# Patient Record
Sex: Male | Born: 1980 | Race: White | Hispanic: Yes | Marital: Single | Smoking: Current some day smoker
Health system: Southern US, Community
[De-identification: ages and names within clinical notes are randomized; demographics above are authoritative.]

---

## 2010-01-04 ENCOUNTER — Emergency Department (HOSPITAL_BASED_OUTPATIENT_CLINIC_OR_DEPARTMENT_OTHER): Admission: EM | Admit: 2010-01-04 | Discharge: 2010-01-04 | Payer: Self-pay | Admitting: Gastroenterology

## 2010-06-09 LAB — DIFFERENTIAL
Eosinophils Relative: 2 % (ref 0–5)
Lymphocytes Relative: 24 % (ref 12–46)
Lymphs Abs: 1.4 10*3/uL (ref 0.7–4.0)
Monocytes Relative: 7 % (ref 3–12)

## 2010-06-09 LAB — RAPID STREP SCREEN (MED CTR MEBANE ONLY): Streptococcus, Group A Screen (Direct): NEGATIVE

## 2010-06-09 LAB — CBC
HCT: 47.1 % (ref 39.0–52.0)
Hemoglobin: 16.4 g/dL (ref 13.0–17.0)
MCV: 90.5 fL (ref 78.0–100.0)
RBC: 5.21 MIL/uL (ref 4.22–5.81)
WBC: 6 10*3/uL (ref 4.0–10.5)

## 2010-06-09 LAB — HEPATITIS B SURFACE ANTIGEN: Hepatitis B Surface Ag: NEGATIVE

## 2010-06-09 LAB — COMPREHENSIVE METABOLIC PANEL
BUN: 14 mg/dL (ref 6–23)
Chloride: 103 mEq/L (ref 96–112)
GFR calc non Af Amer: >60 mL/min (ref >60)
Glucose, Bld: 98 mg/dL (ref 70–99)
Potassium: 4.2 mEq/L (ref 3.5–5.1)

## 2010-06-09 LAB — URINALYSIS, ROUTINE W REFLEX MICROSCOPIC
Hgb urine dipstick: NEGATIVE
Nitrite: NEGATIVE
Specific Gravity, Urine: 1.018 (ref 1.005–1.030)
Urobilinogen, UA: 0.2 mg/dL (ref 0.0–1.0)
pH: 7 (ref 5.0–8.0)

## 2010-06-09 LAB — SEDIMENTATION RATE: Sed Rate: 2 mm/hr (ref 0–16)

## 2010-06-09 LAB — GC/CHLAMYDIA PROBE AMP, GENITAL
Chlamydia, DNA Probe: NEGATIVE
GC Probe Amp, Genital: NEGATIVE

## 2012-03-28 ENCOUNTER — Ambulatory Visit: Payer: Self-pay

## 2012-03-28 ENCOUNTER — Ambulatory Visit: Payer: Self-pay | Admitting: Family Medicine

## 2012-03-28 VITALS — BP 130/83 | HR 64 | Temp 98.4°F | Resp 16 | Ht 63.5 in | Wt 194.0 lb

## 2012-03-28 DIAGNOSIS — M545 Low back pain, unspecified: Secondary | ICD-10-CM

## 2012-03-28 DIAGNOSIS — S39012A Strain of muscle, fascia and tendon of lower back, initial encounter: Secondary | ICD-10-CM

## 2012-03-28 DIAGNOSIS — K5900 Constipation, unspecified: Secondary | ICD-10-CM

## 2012-03-28 DIAGNOSIS — R1012 Left upper quadrant pain: Secondary | ICD-10-CM

## 2012-03-28 LAB — POCT CBC
Granulocyte percent: 69.5 %G (ref 37–80)
HCT, POC: 53.7 % (ref 43.5–53.7)
Hemoglobin: 17.5 g/dL (ref 14.1–18.1)
POC Granulocyte: 4.4 (ref 2–6.9)

## 2012-03-28 LAB — POCT UA - MICROSCOPIC ONLY
Bacteria, U Microscopic: NEGATIVE
Mucus, UA: NEGATIVE

## 2012-03-28 LAB — POCT URINALYSIS DIPSTICK
Bilirubin, UA: NEGATIVE
Blood, UA: NEGATIVE
Ketones, UA: NEGATIVE
Leukocytes, UA: NEGATIVE
Spec Grav, UA: 1.02
pH, UA: 6

## 2012-03-28 MED ORDER — METHOCARBAMOL 750 MG PO TABS: ORAL_TABLET | ORAL | Status: DC

## 2012-03-28 MED ORDER — OXAPROZIN 600 MG PO TABS: ORAL_TABLET | ORAL | Status: DC

## 2012-03-28 MED ORDER — HYDROCODONE-ACETAMINOPHEN 5-500 MG PO TABS: ORAL_TABLET | ORAL | Status: AC

## 2012-03-28 NOTE — Progress Notes (Signed)
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 For the past 2 weeks he has had low back pain. It is now working way up to where hurts also up between the scapula. He knows of no specific injury. He does have to handle his equipment he also has had pain in the left upper quadrant of the abdomen. Denies any urinary or bowel symptoms. No nausea or vomiting. He has not had bad back pain problems like this is in the past.  Objective: Stocky gentleman in no major distress. He is uncomfortable when he tried to move around. Mild tenderness along the paraspinous muscles of the back. Most of the tenderness is in the sacral and SI regions of the low back. Abdomen is soft without masses. Mild left upper quadrant tenderness. Bowel sounds normal.  Assessment: Low back pain Upper back pain  left upper quadrant abdominal pain  Plan: LS spine CBC Urine  [facility] reading (PRIMARY) by  Dr. Alwyn Ren No acute findings  Results for orders placed in visit on 03/28/12  POCT CBC      Component Value Range   WBC 6.4  4.6 - 10.2 K/uL   Lymph, poc 1.5  0.6 - 3.4   POC LYMPH PERCENT 24.2  10 - 50 %L   MID (cbc) 0.4  0 - 0.9   POC MID % 6.3  0 - 12 %M   POC Granulocyte 4.4  2 - 6.9   Granulocyte percent 69.5  37 - 80 %G   RBC 5.77  4.69 - 6.13 M/uL   Hemoglobin 17.5  14.1 - 18.1 g/dL   HCT, POC 40.9  81.1 - 53.7 %   MCV 93.1  80 - 97 fL   MCH, POC 30.3  27 - 31.2 pg   MCHC 32.6  31.8 - 35.4 g/dL   RDW, POC 91.4     Platelet Count, POC 252  142 - 424 K/uL   MPV 8.6  0 - 99.8 fL  POCT UA - MICROSCOPIC ONLY      Component Value Range   WBC, Ur, HPF, POC 0-5     RBC, urine, microscopic 0-1     Bacteria, U Microscopic neg     Mucus, UA neg     Epithelial cells, urine per micros 1-3     Crystals, Ur, HPF, POC neg     Casts, Ur, LPF, POC neg     Yeast, UA neg    POCT URINALYSIS DIPSTICK      Component Value Range   Color, UA yellow     Clarity, UA clear     Glucose, UA neg     Bilirubin, UA neg     Ketones, UA neg     Spec Grav, UA 1.020     Blood, UA neg     pH, UA 6.0     Protein, UA neg     Urobilinogen, UA 0.2     Nitrite, UA neg     Leukocytes, UA Negative     Assessment: Back pain  left upper quadrant abdominal pain, possibly secondary to constipation  Plan: Pain medicine, muscle relaxant, and some MiraLax.Marland Kitchen

## 2012-03-28 NOTE — Patient Instructions (Addendum)
The back pain is being caused by back strain of the muscles. You need to try to avoid heavy lifting and straining for the next week to try and rest her back some. I am prescribing some muscle relaxant and pain medication for your back.  The pain in the left upper abdomen is probably being caused by some stool that is backed up high in your colon. You have a lot of gas. I want you to take something called MiraLax which she can buy at the pharmacy. Take one dose every day until your stools are loose. Then it can be stopped.  Return if worse.

## 2014-12-04 IMAGING — CR DG LUMBAR SPINE 2-3V
3 series · 3 of 3 positions shown · non-contrast
Comparison: No priors.

CLINICAL DATA: Low back pain.

LUMBAR SPINE - 2-3 VIEW

[AP]
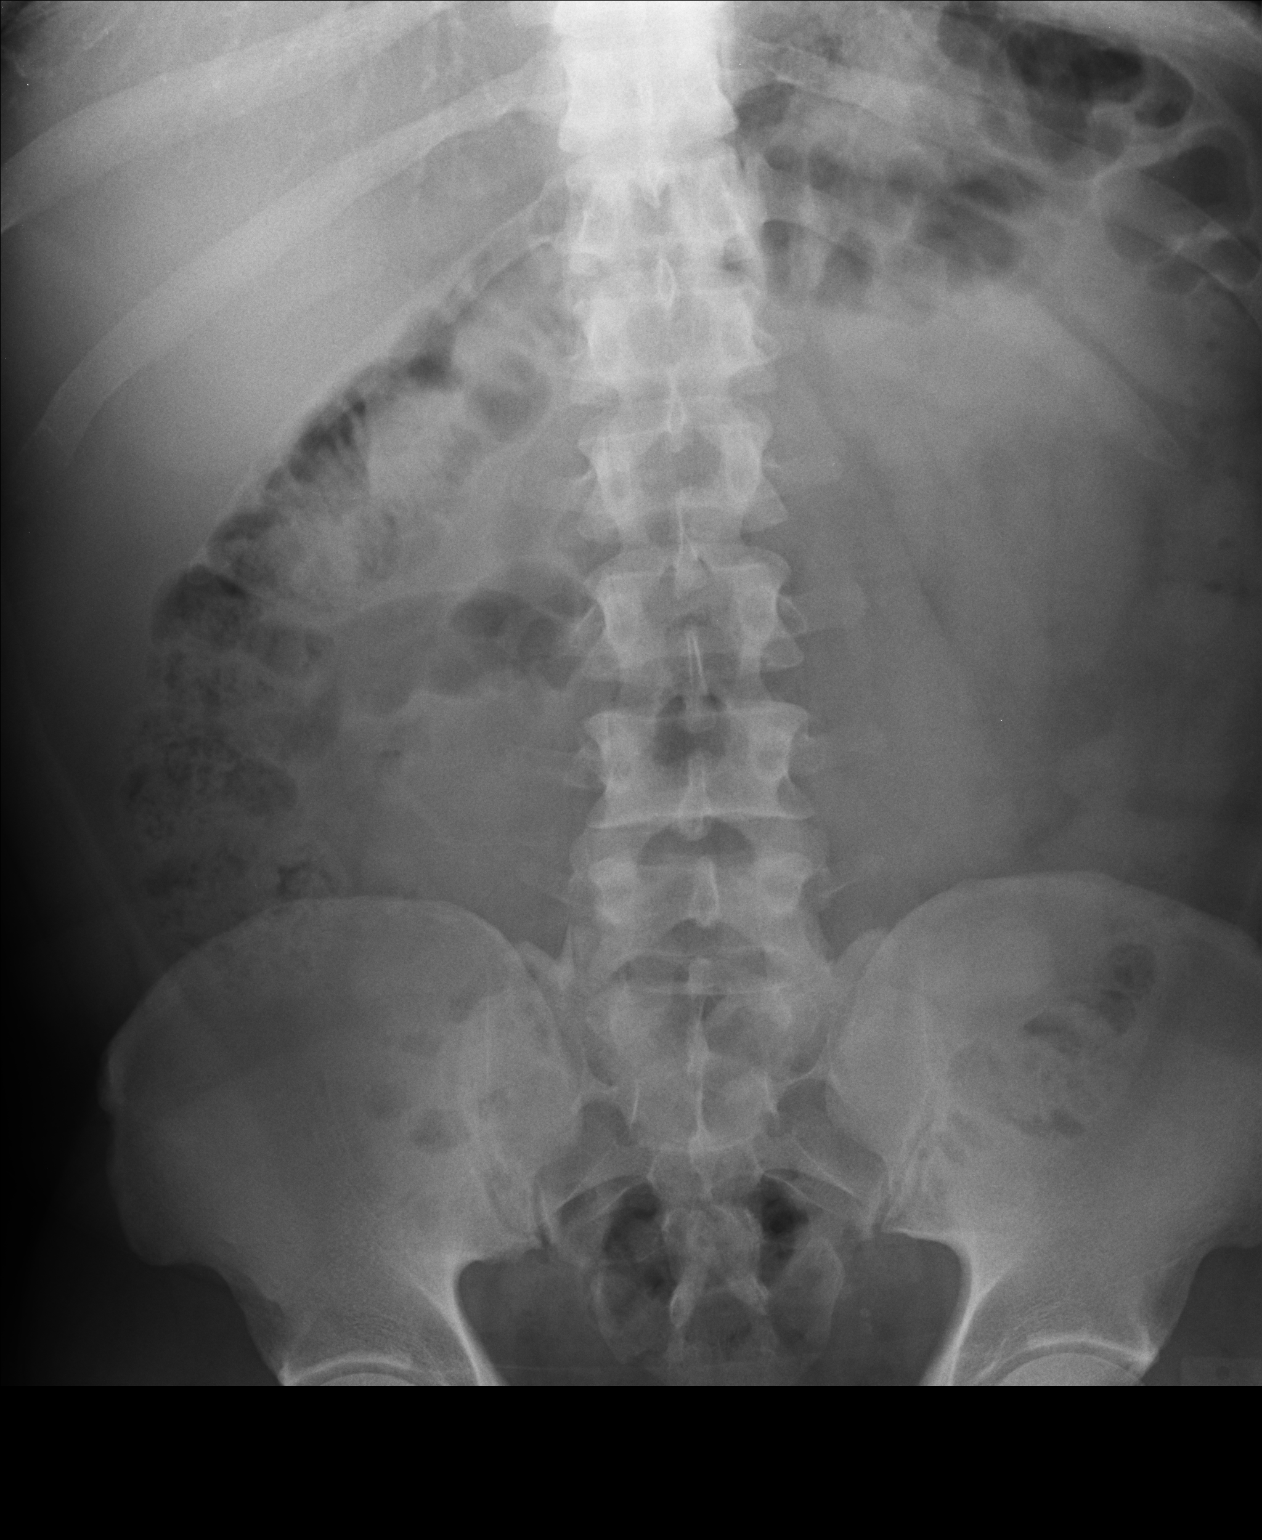

[lateral]
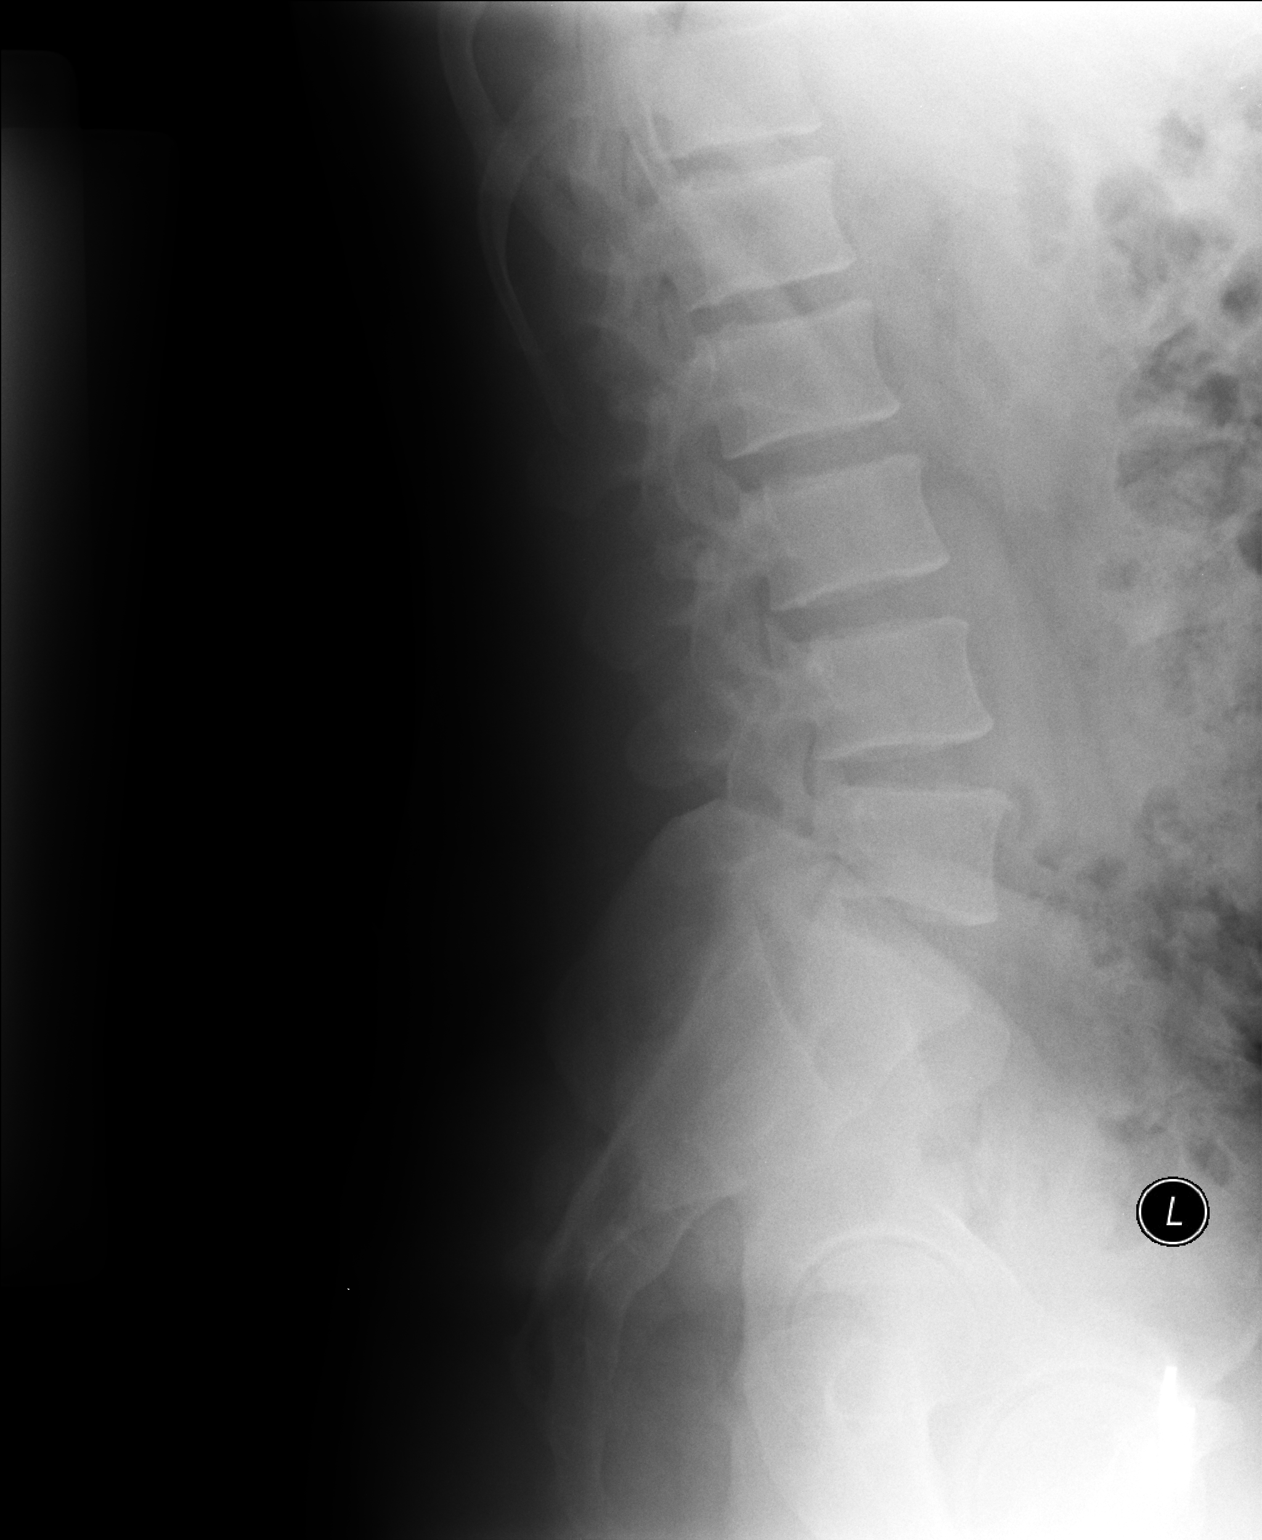

[l5 s1]
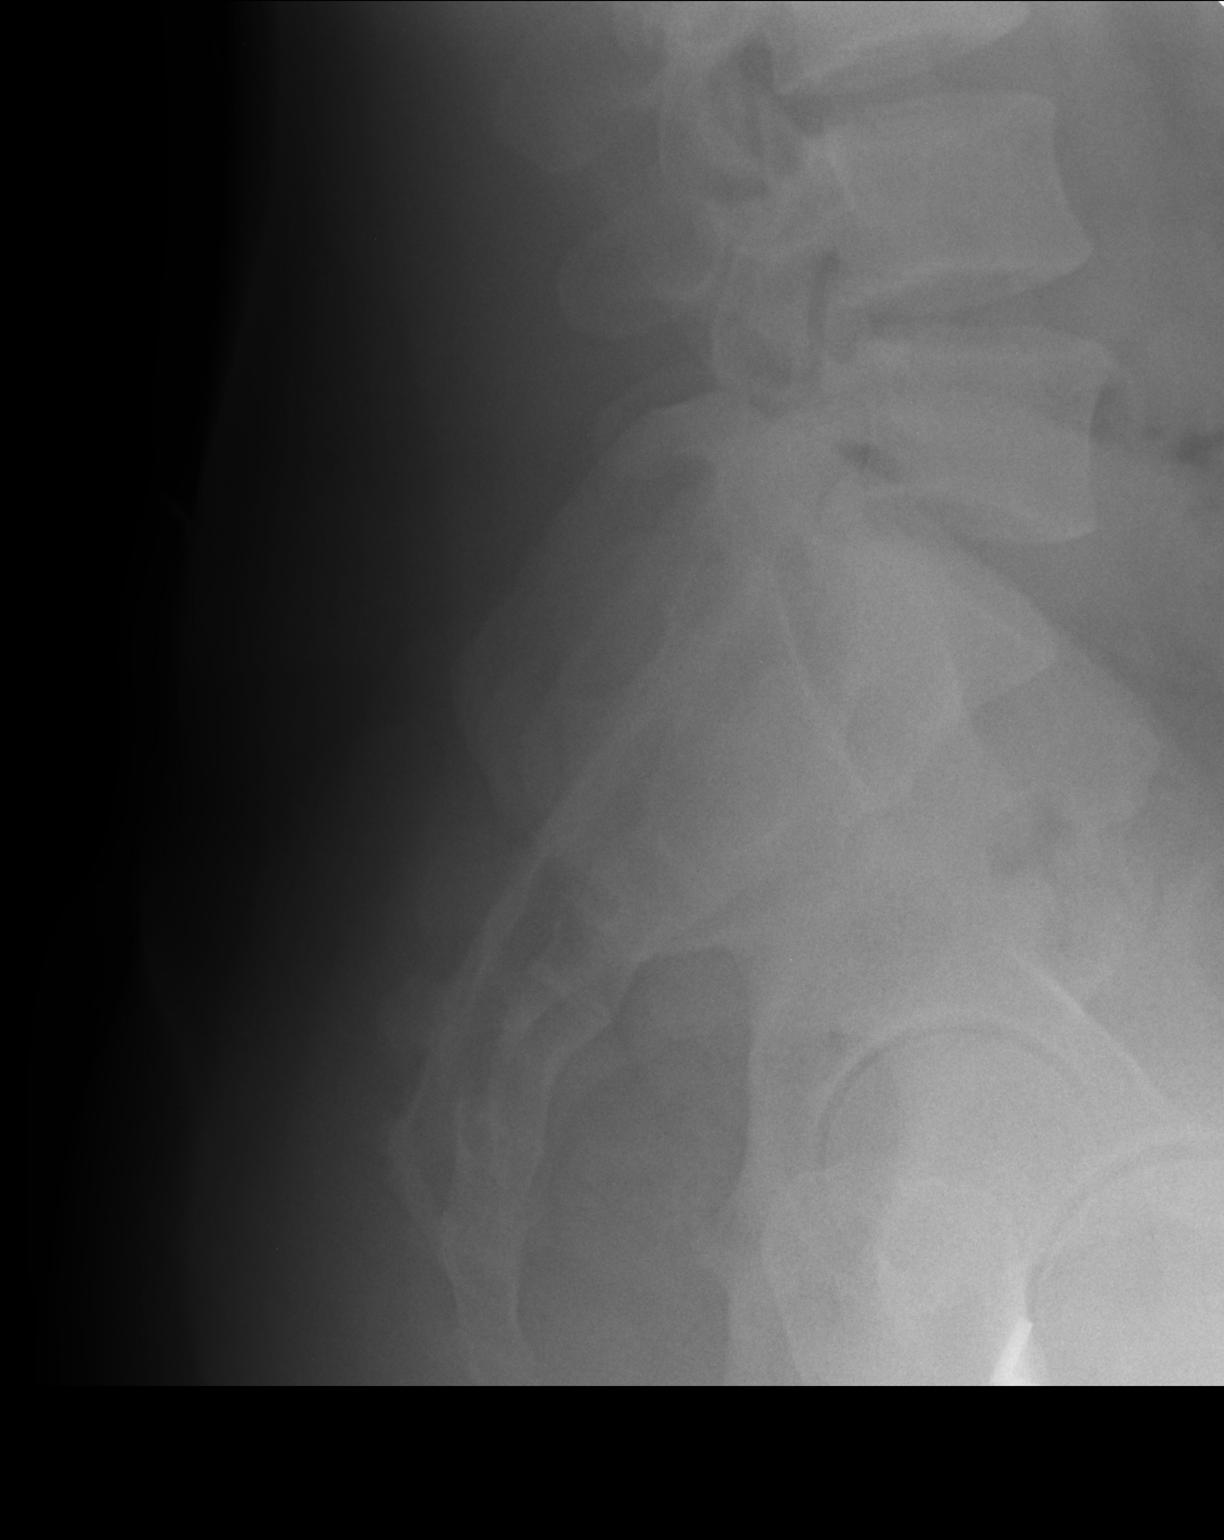

[3 of 3 positions shown; findings below may reference images not displayed]

FINDINGS: Three views of the lumbar spine demonstrate no acute
displaced fracture or definite compression type fracture.
Alignment is anatomic.
IMPRESSION: 1.  No acute radiographic abnormality of the thoracic spine.

## 2016-07-19 ENCOUNTER — Ambulatory Visit (INDEPENDENT_AMBULATORY_CARE_PROVIDER_SITE_OTHER): Payer: Self-pay | Admitting: Emergency Medicine

## 2016-07-19 VITALS — BP 125/84 | HR 62 | Temp 98.0°F | Resp 18 | Ht 63.5 in | Wt 199.0 lb

## 2016-07-19 DIAGNOSIS — H65192 Other acute nonsuppurative otitis media, left ear: Secondary | ICD-10-CM

## 2016-07-19 DIAGNOSIS — H6690 Otitis media, unspecified, unspecified ear: Secondary | ICD-10-CM | POA: Insufficient documentation

## 2016-07-19 DIAGNOSIS — H9203 Otalgia, bilateral: Secondary | ICD-10-CM

## 2016-07-19 MED ORDER — NEOMYCIN-COLIST-HC-THONZONIUM 3.3-3-10-0.5 MG/ML OT SUSP: 3.0000 [drp] | Freq: Three times a day (TID) | OTIC | 0 refills | Status: AC

## 2016-07-19 MED ORDER — AMOXICILLIN 500 MG PO CAPS: 500.0000 mg | ORAL_CAPSULE | Freq: Three times a day (TID) | ORAL | 0 refills | Status: AC

## 2016-07-19 NOTE — Progress Notes (Signed)
Clayton Goodwin 36 y.o.   Chief Complaint  Patient presents with  . Ear Pain    Bilateral    HISTORY OF PRESENT ILLNESS: This is a 36 y.o. male complaining of bilateral earache x 3 weeks. Denies trauma. No other significant symptoms.  HPI   Prior to Admission medications   Medication Sig Start Date End Date Taking? Authorizing Provider  HYDROcodone-acetaminophen (VICODIN) 5-500 MG per tablet Use one every 6 or 8 hours for severe pain only 03/28/12   Peyton Najjar, MD    No Known Allergies  There are no active problems to display for this patient.   No past medical history on file.  No past surgical history on file.  Social History   Social History  . Marital status: Single    Spouse name: N/A  . Number of children: N/A  . Years of education: N/A   Occupational History  . Not on file.   Social History Main Topics  . Smoking status: Current Some Day Smoker  . Smokeless tobacco: Never Used  . Alcohol use Not on file  . Drug use: Unknown  . Sexual activity: Not on file   Other Topics Concern  . Not on file   Social History Narrative  . No narrative on file    No family history on file.   Review of Systems  Constitutional: Negative.  Negative for chills and fever.  HENT: Positive for ear pain. Negative for congestion, ear discharge, nosebleeds, sinus pain and sore throat.   Eyes: Negative for blurred vision, double vision, discharge and redness.  Respiratory: Negative.  Negative for shortness of breath.   Cardiovascular: Negative.   Gastrointestinal: Negative for nausea and vomiting.  Skin: Negative for rash.  Neurological: Negative for dizziness and headaches.  All other systems reviewed and are negative.  Vitals:   07/19/16 0935  BP: 125/84  Pulse: 62  Resp: 18  Temp: 98 F (36.7 C)     Physical Exam  Constitutional: He is oriented to person, place, and time. He appears well-developed and well-nourished.  HENT:  Head: Normocephalic and  atraumatic.  Right Ear: Hearing and ear canal normal. No drainage. No mastoid tenderness. Tympanic membrane is not injected.  Left Ear: Hearing normal. No drainage. No mastoid tenderness. Tympanic membrane is injected.  Eyes: Conjunctivae and EOM are normal. Pupils are equal, round, and reactive to light.  Neck: Normal range of motion. Neck supple.  Cardiovascular: Normal rate and regular rhythm.   Pulmonary/Chest: Effort normal and breath sounds normal.  Neurological: He is alert and oriented to person, place, and time.  Skin: Skin is warm and dry. Capillary refill takes less than 2 seconds.  Psychiatric: He has a normal mood and affect. His behavior is normal.  Vitals reviewed.    ASSESSMENT & PLAN: Victoriano was seen today for ear pain.  Diagnoses and all orders for this visit:  Other acute nonsuppurative otitis media of left ear, recurrence not specified  Acute otalgia, bilateral  Other orders -     amoxicillin (AMOXIL) 500 MG capsule; Take 1 capsule (500 mg total) by mouth 3 (three) times daily. -     neomycin-colistin-hydrocortisone-thonzonium (CORTISPORIN-TC) 3.05-27-08-0.5 MG/ML otic suspension; Place 3 drops into both ears 3 (three) times daily.    Patient Instructions       IF you received an x-ray today, you will receive an invoice from [facility]. Please contact [facility] at [phone] with questions or concerns regarding your invoice.  IF you received labwork today, you will receive an invoice from [facility]. Please contact [facility] at [phone] with questions or concerns regarding your invoice.   Our billing staff will not be able to assist you with questions regarding bills from these companies.  You will be contacted with the lab results as soon as they are available. The fastest way to get your results is to activate your My Chart account. Instructions are located on the last page of this paperwork. If you have not heard from [place] regarding  the results in 2 weeks, please contact this office.     Otitis media - Adultos (Otitis Media, Adult) La otitis media es la irritacin, dolor e inflamacin (hinchazn) en el espacio que se encuentra detrs del tmpano (odo medio). La causa puede ser Vella Raring o una infeccin. Generalmente aparece junto con un resfro. CUIDADOS EN EL HOGAR  Tome los medicamentos segn las indicaciones. Finalice la prescripcin completa, aunque se sienta mejor.  Solo tome medicamentos de venta libre o recetados para Chief Technology Officer, Dentist o fiebre, como le indique el mdico.  Lamar a las consultas de control con el mdico, segn las indicaciones. SOLICITE AYUDA SI:  Tiene otitis media slo en un odo o sangra por la nariz, o ambas cosas.  Advierte un bulto en el cuello.  No mejora luego de 3-5 das.  Empeora en lugar de mejorar. SOLICITE AYUDA DE INMEDIATO SI:  Siente un dolor intenso y no lo [employer].  Tiene irritacin, hinchazn o dolor en el odo.  Presenta rigidez en el cuello.  No puede mover una parte de su rostro (parlisis).  Nota que el hueso que se encuentra detrs de su oreja le duele al tocarlo. ASEGRESE DE QUE:  Comprende estas instrucciones.  Controlar su afeccin.  Recibir ayuda de inmediato si no mejora o si empeora. Esta informacin no tiene Theme park manager el consejo del mdico. Asegrese de hacerle al mdico cualquier pregunta que tenga. Document Released: 04/15/2010 Document Revised: 04/03/2014 Document Reviewed: 10/08/2012 Elsevier Interactive Patient Education  2017 Elsevier Inc.      Edwina Barth, MD [facility]

## 2016-07-19 NOTE — Patient Instructions (Addendum)
     IF you received an x-ray today, you will receive an invoice from [facility]. Please contact [facility] at [phone] with questions or concerns regarding your invoice.   IF you received labwork today, you will receive an invoice from [facility]. Please contact [facility] at [phone] with questions or concerns regarding your invoice.   Our billing staff will not be able to assist you with questions regarding bills from these companies.  You will be contacted with the lab results as soon as they are available. The fastest way to get your results is to activate your My Chart account. Instructions are located on the last page of this paperwork. If you have not heard from us regarding the results in 2 weeks, please contact this office.    ' Otitis media - Adultos (Otitis Media, Adult) La otitis media es la irritacin, dolor e inflamacin (hinchazn) en el espacio que se encuentra detrs del tmpano (odo medio). La causa puede ser una alergia o una infeccin. Generalmente aparece junto con un resfro. CUIDADOS EN EL HOGAR  Tome los medicamentos segn las indicaciones. Finalice la prescripcin completa, aunque se sienta mejor.  Solo tome medicamentos de venta libre o recetados para el dolor, malestar o fiebre, como le indique el mdico.  Concurra a las consultas de control con el mdico, segn las indicaciones.  SOLICITE AYUDA SI:  Tiene otitis media slo en un odo o sangra por la nariz, o ambas cosas.  Advierte un bulto en el cuello.  No mejora luego de 3-5 das.  Empeora en lugar de mejorar.  SOLICITE AYUDA DE INMEDIATO SI:  Siente un dolor intenso y no lo alivian los medicamentos.  Tiene irritacin, hinchazn o dolor en el odo.  Presenta rigidez en el cuello.  No puede mover una parte de su rostro (parlisis).  Nota que el hueso que se encuentra detrs de su oreja le duele al tocarlo.  ASEGRESE DE QUE:  Comprende estas  instrucciones.  Controlar su afeccin.  Recibir ayuda de inmediato si no mejora o si empeora.  Esta informacin no tiene como fin reemplazar el consejo del mdico. Asegrese de hacerle al mdico cualquier pregunta que tenga. Document Released: 04/15/2010 Document Revised: 04/03/2014 Document Reviewed: 10/08/2012 Elsevier Interactive Patient Education  2017 Elsevier Inc.
